# Patient Record
Sex: Male | Born: 1995 | Race: White | Hispanic: No | Marital: Single | Smoking: Never smoker
Health system: Southern US, Community
[De-identification: ages and names within clinical notes are randomized; demographics above are authoritative.]

## PROBLEM LIST (undated history)

## (undated) DIAGNOSIS — R591 Generalized enlarged lymph nodes: Secondary | ICD-10-CM

## (undated) DIAGNOSIS — R002 Palpitations: Secondary | ICD-10-CM

## (undated) DIAGNOSIS — R0789 Other chest pain: Secondary | ICD-10-CM

## (undated) DIAGNOSIS — Z8659 Personal history of other mental and behavioral disorders: Secondary | ICD-10-CM

## (undated) DIAGNOSIS — F909 Attention-deficit hyperactivity disorder, unspecified type: Secondary | ICD-10-CM

## (undated) DIAGNOSIS — L0390 Cellulitis, unspecified: Secondary | ICD-10-CM

## (undated) DIAGNOSIS — R0602 Shortness of breath: Secondary | ICD-10-CM

## (undated) HISTORY — DX: Palpitations: R00.2

## (undated) HISTORY — PX: TONSILECTOMY, ADENOIDECTOMY, BILATERAL MYRINGOTOMY AND TUBES: SHX2538

## (undated) HISTORY — DX: Cellulitis, unspecified: L03.90

## (undated) HISTORY — DX: Shortness of breath: R06.02

## (undated) HISTORY — DX: Generalized enlarged lymph nodes: R59.1

## (undated) HISTORY — DX: Personal history of other mental and behavioral disorders: Z86.59

## (undated) HISTORY — DX: Other chest pain: R07.89

---
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
## 2016-08-14 ENCOUNTER — Emergency Department (HOSPITAL_COMMUNITY): Payer: 59

## 2016-08-14 ENCOUNTER — Encounter (HOSPITAL_COMMUNITY): Payer: Self-pay | Admitting: Emergency Medicine

## 2016-08-14 ENCOUNTER — Emergency Department (HOSPITAL_COMMUNITY)
Admission: EM | Admit: 2016-08-14 | Discharge: 2016-08-15 | Disposition: A | Discharge status: Home / Self Care | Payer: 59 | Attending: Emergency Medicine | Admitting: Emergency Medicine

## 2016-08-14 DIAGNOSIS — S0993XA Unspecified injury of face, initial encounter: Secondary | ICD-10-CM | POA: Diagnosis present

## 2016-08-14 DIAGNOSIS — W501XXA Accidental kick by another person, initial encounter: Secondary | ICD-10-CM | POA: Diagnosis not present

## 2016-08-14 DIAGNOSIS — S022XXA Fracture of nasal bones, initial encounter for closed fracture: Secondary | ICD-10-CM | POA: Insufficient documentation

## 2016-08-14 DIAGNOSIS — S0501XA Injury of conjunctiva and corneal abrasion without foreign body, right eye, initial encounter: Secondary | ICD-10-CM

## 2016-08-14 DIAGNOSIS — Y9389 Activity, other specified: Secondary | ICD-10-CM | POA: Insufficient documentation

## 2016-08-14 DIAGNOSIS — F909 Attention-deficit hyperactivity disorder, unspecified type: Secondary | ICD-10-CM | POA: Insufficient documentation

## 2016-08-14 DIAGNOSIS — Y999 Unspecified external cause status: Secondary | ICD-10-CM | POA: Diagnosis not present

## 2016-08-14 DIAGNOSIS — Y9289 Other specified places as the place of occurrence of the external cause: Secondary | ICD-10-CM | POA: Insufficient documentation

## 2016-08-14 DIAGNOSIS — S0990XA Unspecified injury of head, initial encounter: Secondary | ICD-10-CM

## 2016-08-14 DIAGNOSIS — S199XXA Unspecified injury of neck, initial encounter: Secondary | ICD-10-CM | POA: Diagnosis not present

## 2016-08-14 HISTORY — DX: Attention-deficit hyperactivity disorder, unspecified type: F90.9
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
## 2016-08-15 MED ORDER — ERYTHROMYCIN 5 MG/GM OP OINT
TOPICAL_OINTMENT | Freq: Every day | OPHTHALMIC | Status: DC
  Administered 2016-08-15: 1 via OPHTHALMIC
  Filled 2016-08-15: qty 3.5

## 2016-08-15 MED ORDER — TETRACAINE HCL 0.5 % OP SOLN
1.0000 [drp] | Freq: Once | OPHTHALMIC | Status: AC
  Administered 2016-08-15: 1 [drp] via OPHTHALMIC
  Filled 2016-08-15: qty 4

## 2016-08-15 MED ORDER — FLUORESCEIN SODIUM 0.6 MG OP STRP
1.0000 | ORAL_STRIP | Freq: Once | OPHTHALMIC | Status: AC
  Administered 2016-08-15: 1 via OPHTHALMIC
  Filled 2016-08-15: qty 1

## 2016-08-15 MED ORDER — IBUPROFEN 800 MG PO TABS
800.0000 mg | ORAL_TABLET | Freq: Once | ORAL | Status: AC
  Administered 2016-08-15: 800 mg via ORAL
  Filled 2016-08-15: qty 1

## 2016-08-15 MED ORDER — CIPROFLOXACIN HCL 0.3 % OP SOLN
1.0000 [drp] | OPHTHALMIC | Status: DC
  Administered 2016-08-15: 1 [drp] via OPHTHALMIC
  Filled 2016-08-15: qty 2.5

## 2016-08-15 MED ORDER — OXYMETAZOLINE HCL 0.05 % NA SOLN
1.0000 | Freq: Once | NASAL | Status: AC
  Administered 2016-08-15: 1 via NASAL
  Filled 2016-08-15: qty 15
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
## 2016-08-15 NOTE — ED Notes (Signed)
Provider at bedside

## 2018-03-30 ENCOUNTER — Other Ambulatory Visit: Payer: Self-pay | Admitting: Physician Assistant

## 2018-03-30 ENCOUNTER — Ambulatory Visit
Admission: RE | Admit: 2018-03-30 | Discharge: 2018-03-30 | Disposition: A | Discharge status: Home / Self Care | Payer: BLUE CROSS/BLUE SHIELD | Source: Ambulatory Visit | Attending: Physician Assistant | Admitting: Physician Assistant

## 2018-03-30 DIAGNOSIS — R002 Palpitations: Secondary | ICD-10-CM

## 2018-03-30 DIAGNOSIS — R0602 Shortness of breath: Secondary | ICD-10-CM

## 2018-03-30 DIAGNOSIS — R0789 Other chest pain: Secondary | ICD-10-CM
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
## 2019-06-13 ENCOUNTER — Telehealth: Payer: Self-pay

## 2019-06-13 NOTE — Telephone Encounter (Signed)
NOTES ON FILE FROM [facility] [phone], SENT REFERRAL TO SCHEDULING 

## 2019-07-01 ENCOUNTER — Ambulatory Visit: Payer: BC Managed Care – PPO | Admitting: Cardiology

## 2019-07-11 ENCOUNTER — Ambulatory Visit: Payer: BLUE CROSS/BLUE SHIELD | Admitting: Skilled Nursing Facility1

## 2019-07-15 ENCOUNTER — Encounter: Payer: Self-pay | Admitting: Physician Assistant

## 2020-01-13 IMAGING — CR DG CHEST 2V
2 series · 2 of 2 positions shown · non-contrast
Comparison: None.

CLINICAL DATA: Shortness of breath for 1 year.

EXAM:
CHEST - 2 VIEW

[w chest pa]
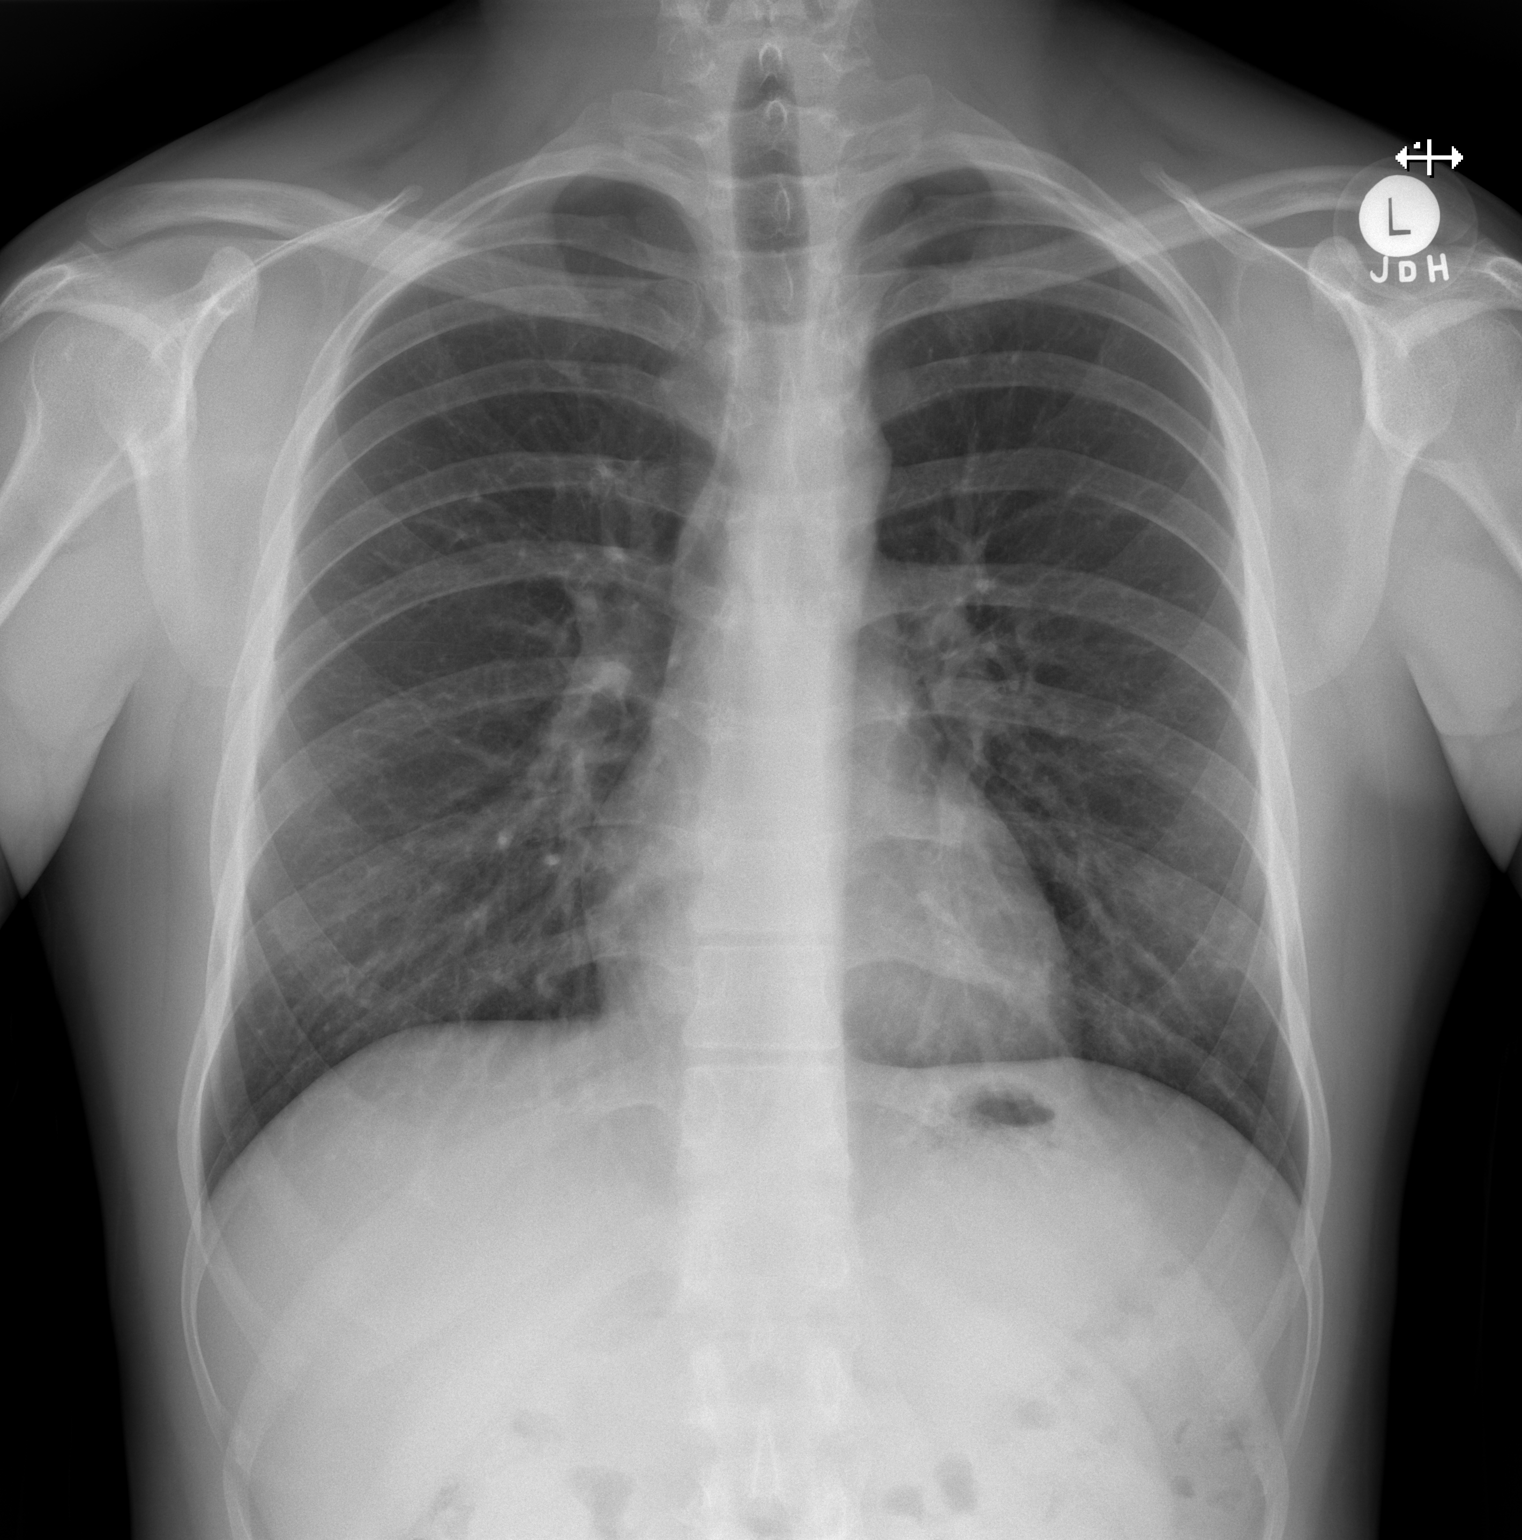

[w chest lat]
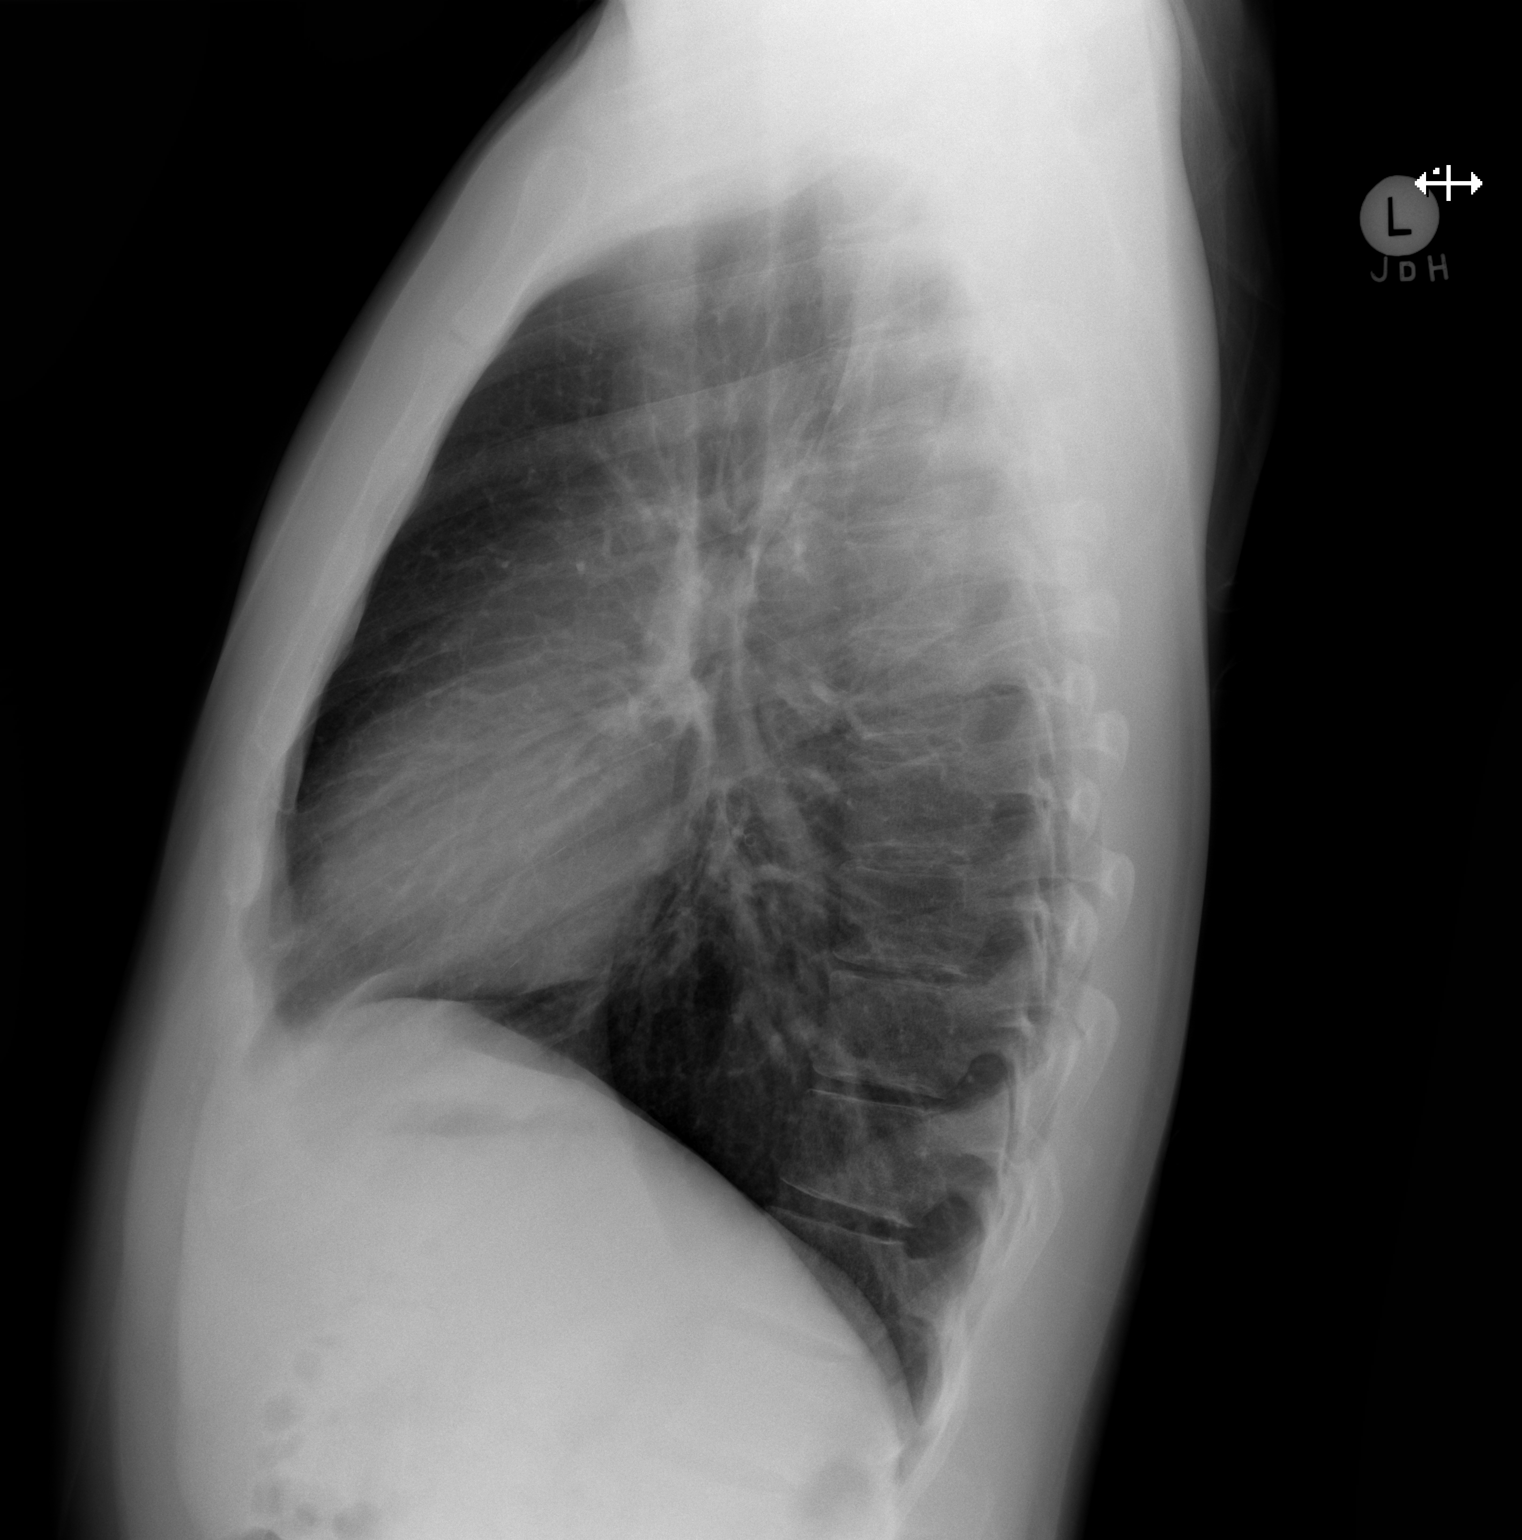

[2 of 2 positions shown; findings below may reference images not displayed]

FINDINGS: The heart size and mediastinal contours are within normal limits.
Both lungs are clear. The visualized skeletal structures are
unremarkable.
IMPRESSION: No active cardiopulmonary disease.

## 2020-04-09 ENCOUNTER — Ambulatory Visit: Payer: BC Managed Care – PPO | Admitting: Dermatology

## 2022-01-15 DIAGNOSIS — Z729 Problem related to lifestyle, unspecified: Secondary | ICD-10-CM | POA: Diagnosis not present

## 2022-01-15 DIAGNOSIS — Z600 Problems of adjustment to life-cycle transitions: Secondary | ICD-10-CM | POA: Diagnosis not present

## 2022-01-15 DIAGNOSIS — Z609 Problem related to social environment, unspecified: Secondary | ICD-10-CM | POA: Diagnosis not present

## 2022-01-15 DIAGNOSIS — R69 Illness, unspecified: Secondary | ICD-10-CM | POA: Diagnosis not present

## 2022-01-22 DIAGNOSIS — Z600 Problems of adjustment to life-cycle transitions: Secondary | ICD-10-CM | POA: Diagnosis not present

## 2022-01-22 DIAGNOSIS — Z569 Unspecified problems related to employment: Secondary | ICD-10-CM | POA: Diagnosis not present

## 2022-01-22 DIAGNOSIS — Z729 Problem related to lifestyle, unspecified: Secondary | ICD-10-CM | POA: Diagnosis not present

## 2022-01-22 DIAGNOSIS — R69 Illness, unspecified: Secondary | ICD-10-CM | POA: Diagnosis not present

## 2022-01-29 DIAGNOSIS — R69 Illness, unspecified: Secondary | ICD-10-CM | POA: Diagnosis not present

## 2022-01-29 DIAGNOSIS — Z569 Unspecified problems related to employment: Secondary | ICD-10-CM | POA: Diagnosis not present

## 2022-01-29 DIAGNOSIS — Z62811 Personal history of psychological abuse in childhood: Secondary | ICD-10-CM | POA: Diagnosis not present

## 2022-01-29 DIAGNOSIS — F902 Attention-deficit hyperactivity disorder, combined type: Secondary | ICD-10-CM | POA: Diagnosis not present

## 2022-02-05 DIAGNOSIS — Z600 Problems of adjustment to life-cycle transitions: Secondary | ICD-10-CM | POA: Diagnosis not present

## 2022-02-05 DIAGNOSIS — Z729 Problem related to lifestyle, unspecified: Secondary | ICD-10-CM | POA: Diagnosis not present

## 2022-02-05 DIAGNOSIS — R69 Illness, unspecified: Secondary | ICD-10-CM | POA: Diagnosis not present

## 2022-02-05 DIAGNOSIS — Z569 Unspecified problems related to employment: Secondary | ICD-10-CM | POA: Diagnosis not present

## 2022-02-12 DIAGNOSIS — Z600 Problems of adjustment to life-cycle transitions: Secondary | ICD-10-CM | POA: Diagnosis not present

## 2022-02-12 DIAGNOSIS — Z609 Problem related to social environment, unspecified: Secondary | ICD-10-CM | POA: Diagnosis not present

## 2022-02-12 DIAGNOSIS — Z729 Problem related to lifestyle, unspecified: Secondary | ICD-10-CM | POA: Diagnosis not present

## 2022-02-12 DIAGNOSIS — R69 Illness, unspecified: Secondary | ICD-10-CM | POA: Diagnosis not present

## 2022-02-19 DIAGNOSIS — Z600 Problems of adjustment to life-cycle transitions: Secondary | ICD-10-CM | POA: Diagnosis not present

## 2022-02-19 DIAGNOSIS — Z729 Problem related to lifestyle, unspecified: Secondary | ICD-10-CM | POA: Diagnosis not present

## 2022-02-19 DIAGNOSIS — Z609 Problem related to social environment, unspecified: Secondary | ICD-10-CM | POA: Diagnosis not present

## 2022-02-19 DIAGNOSIS — R69 Illness, unspecified: Secondary | ICD-10-CM | POA: Diagnosis not present

## 2022-02-26 DIAGNOSIS — R69 Illness, unspecified: Secondary | ICD-10-CM | POA: Diagnosis not present

## 2022-02-26 DIAGNOSIS — Z609 Problem related to social environment, unspecified: Secondary | ICD-10-CM | POA: Diagnosis not present

## 2022-02-26 DIAGNOSIS — Z634 Disappearance and death of family member: Secondary | ICD-10-CM | POA: Diagnosis not present

## 2022-03-05 DIAGNOSIS — Z634 Disappearance and death of family member: Secondary | ICD-10-CM | POA: Diagnosis not present

## 2022-03-05 DIAGNOSIS — Z729 Problem related to lifestyle, unspecified: Secondary | ICD-10-CM | POA: Diagnosis not present

## 2022-03-05 DIAGNOSIS — Z609 Problem related to social environment, unspecified: Secondary | ICD-10-CM | POA: Diagnosis not present

## 2022-03-05 DIAGNOSIS — R69 Illness, unspecified: Secondary | ICD-10-CM | POA: Diagnosis not present

## 2022-03-12 DIAGNOSIS — Z600 Problems of adjustment to life-cycle transitions: Secondary | ICD-10-CM | POA: Diagnosis not present

## 2022-03-12 DIAGNOSIS — R69 Illness, unspecified: Secondary | ICD-10-CM | POA: Diagnosis not present

## 2022-03-12 DIAGNOSIS — Z729 Problem related to lifestyle, unspecified: Secondary | ICD-10-CM | POA: Diagnosis not present

## 2022-03-12 DIAGNOSIS — Z634 Disappearance and death of family member: Secondary | ICD-10-CM | POA: Diagnosis not present

## 2022-03-19 DIAGNOSIS — Z729 Problem related to lifestyle, unspecified: Secondary | ICD-10-CM | POA: Diagnosis not present

## 2022-03-19 DIAGNOSIS — Z609 Problem related to social environment, unspecified: Secondary | ICD-10-CM | POA: Diagnosis not present

## 2022-03-19 DIAGNOSIS — R69 Illness, unspecified: Secondary | ICD-10-CM | POA: Diagnosis not present

## 2022-03-19 DIAGNOSIS — Z600 Problems of adjustment to life-cycle transitions: Secondary | ICD-10-CM | POA: Diagnosis not present

## 2022-03-26 DIAGNOSIS — R69 Illness, unspecified: Secondary | ICD-10-CM | POA: Diagnosis not present

## 2022-03-26 DIAGNOSIS — Z569 Unspecified problems related to employment: Secondary | ICD-10-CM | POA: Diagnosis not present

## 2022-03-26 DIAGNOSIS — Z600 Problems of adjustment to life-cycle transitions: Secondary | ICD-10-CM | POA: Diagnosis not present

## 2022-03-26 DIAGNOSIS — Z609 Problem related to social environment, unspecified: Secondary | ICD-10-CM | POA: Diagnosis not present

## 2022-04-02 DIAGNOSIS — Z609 Problem related to social environment, unspecified: Secondary | ICD-10-CM | POA: Diagnosis not present

## 2022-04-02 DIAGNOSIS — R69 Illness, unspecified: Secondary | ICD-10-CM | POA: Diagnosis not present

## 2022-04-02 DIAGNOSIS — Z600 Problems of adjustment to life-cycle transitions: Secondary | ICD-10-CM | POA: Diagnosis not present

## 2022-04-02 DIAGNOSIS — F900 Attention-deficit hyperactivity disorder, predominantly inattentive type: Secondary | ICD-10-CM | POA: Diagnosis not present

## 2022-04-09 DIAGNOSIS — Z600 Problems of adjustment to life-cycle transitions: Secondary | ICD-10-CM | POA: Diagnosis not present

## 2022-04-09 DIAGNOSIS — Z569 Unspecified problems related to employment: Secondary | ICD-10-CM | POA: Diagnosis not present

## 2022-04-09 DIAGNOSIS — F900 Attention-deficit hyperactivity disorder, predominantly inattentive type: Secondary | ICD-10-CM | POA: Diagnosis not present

## 2022-04-09 DIAGNOSIS — R69 Illness, unspecified: Secondary | ICD-10-CM | POA: Diagnosis not present

## 2022-04-16 DIAGNOSIS — Z600 Problems of adjustment to life-cycle transitions: Secondary | ICD-10-CM | POA: Diagnosis not present

## 2022-04-16 DIAGNOSIS — R69 Illness, unspecified: Secondary | ICD-10-CM | POA: Diagnosis not present

## 2022-04-16 DIAGNOSIS — Z569 Unspecified problems related to employment: Secondary | ICD-10-CM | POA: Diagnosis not present

## 2022-04-16 DIAGNOSIS — Z609 Problem related to social environment, unspecified: Secondary | ICD-10-CM | POA: Diagnosis not present

## 2022-04-23 DIAGNOSIS — Z600 Problems of adjustment to life-cycle transitions: Secondary | ICD-10-CM | POA: Diagnosis not present

## 2022-04-23 DIAGNOSIS — Z569 Unspecified problems related to employment: Secondary | ICD-10-CM | POA: Diagnosis not present

## 2022-04-23 DIAGNOSIS — Z634 Disappearance and death of family member: Secondary | ICD-10-CM | POA: Diagnosis not present

## 2022-04-23 DIAGNOSIS — R69 Illness, unspecified: Secondary | ICD-10-CM | POA: Diagnosis not present

## 2022-04-30 DIAGNOSIS — R69 Illness, unspecified: Secondary | ICD-10-CM | POA: Diagnosis not present

## 2022-04-30 DIAGNOSIS — Z609 Problem related to social environment, unspecified: Secondary | ICD-10-CM | POA: Diagnosis not present

## 2022-04-30 DIAGNOSIS — Z638 Other specified problems related to primary support group: Secondary | ICD-10-CM | POA: Diagnosis not present

## 2022-04-30 DIAGNOSIS — Z729 Problem related to lifestyle, unspecified: Secondary | ICD-10-CM | POA: Diagnosis not present

## 2022-05-07 DIAGNOSIS — Z609 Problem related to social environment, unspecified: Secondary | ICD-10-CM | POA: Diagnosis not present

## 2022-05-07 DIAGNOSIS — Z638 Other specified problems related to primary support group: Secondary | ICD-10-CM | POA: Diagnosis not present

## 2022-05-07 DIAGNOSIS — R69 Illness, unspecified: Secondary | ICD-10-CM | POA: Diagnosis not present

## 2022-05-07 DIAGNOSIS — Z729 Problem related to lifestyle, unspecified: Secondary | ICD-10-CM | POA: Diagnosis not present

## 2022-05-14 DIAGNOSIS — F900 Attention-deficit hyperactivity disorder, predominantly inattentive type: Secondary | ICD-10-CM | POA: Diagnosis not present

## 2022-05-14 DIAGNOSIS — R69 Illness, unspecified: Secondary | ICD-10-CM | POA: Diagnosis not present

## 2022-05-14 DIAGNOSIS — Z609 Problem related to social environment, unspecified: Secondary | ICD-10-CM | POA: Diagnosis not present

## 2022-05-14 DIAGNOSIS — Z569 Unspecified problems related to employment: Secondary | ICD-10-CM | POA: Diagnosis not present

## 2022-05-21 DIAGNOSIS — Z600 Problems of adjustment to life-cycle transitions: Secondary | ICD-10-CM | POA: Diagnosis not present

## 2022-05-21 DIAGNOSIS — Z569 Unspecified problems related to employment: Secondary | ICD-10-CM | POA: Diagnosis not present

## 2022-05-21 DIAGNOSIS — Z729 Problem related to lifestyle, unspecified: Secondary | ICD-10-CM | POA: Diagnosis not present

## 2022-05-21 DIAGNOSIS — R69 Illness, unspecified: Secondary | ICD-10-CM | POA: Diagnosis not present

## 2022-05-28 DIAGNOSIS — F900 Attention-deficit hyperactivity disorder, predominantly inattentive type: Secondary | ICD-10-CM | POA: Diagnosis not present

## 2022-05-28 DIAGNOSIS — R69 Illness, unspecified: Secondary | ICD-10-CM | POA: Diagnosis not present

## 2022-05-28 DIAGNOSIS — Z729 Problem related to lifestyle, unspecified: Secondary | ICD-10-CM | POA: Diagnosis not present

## 2022-05-28 DIAGNOSIS — Z569 Unspecified problems related to employment: Secondary | ICD-10-CM | POA: Diagnosis not present

## 2022-06-04 DIAGNOSIS — F900 Attention-deficit hyperactivity disorder, predominantly inattentive type: Secondary | ICD-10-CM | POA: Diagnosis not present

## 2022-06-04 DIAGNOSIS — Z569 Unspecified problems related to employment: Secondary | ICD-10-CM | POA: Diagnosis not present

## 2022-06-04 DIAGNOSIS — R69 Illness, unspecified: Secondary | ICD-10-CM | POA: Diagnosis not present

## 2022-06-04 DIAGNOSIS — Z729 Problem related to lifestyle, unspecified: Secondary | ICD-10-CM | POA: Diagnosis not present

## 2022-06-11 DIAGNOSIS — Z62820 Parent-biological child conflict: Secondary | ICD-10-CM | POA: Diagnosis not present

## 2022-06-11 DIAGNOSIS — R69 Illness, unspecified: Secondary | ICD-10-CM | POA: Diagnosis not present

## 2022-06-11 DIAGNOSIS — Z569 Unspecified problems related to employment: Secondary | ICD-10-CM | POA: Diagnosis not present

## 2022-06-11 DIAGNOSIS — F900 Attention-deficit hyperactivity disorder, predominantly inattentive type: Secondary | ICD-10-CM | POA: Diagnosis not present

## 2022-06-14 DIAGNOSIS — N50819 Testicular pain, unspecified: Secondary | ICD-10-CM | POA: Diagnosis not present

## 2022-06-14 DIAGNOSIS — R300 Dysuria: Secondary | ICD-10-CM | POA: Diagnosis not present

## 2022-06-18 DIAGNOSIS — R69 Illness, unspecified: Secondary | ICD-10-CM | POA: Diagnosis not present

## 2022-06-18 DIAGNOSIS — Z569 Unspecified problems related to employment: Secondary | ICD-10-CM | POA: Diagnosis not present

## 2022-06-18 DIAGNOSIS — Z729 Problem related to lifestyle, unspecified: Secondary | ICD-10-CM | POA: Diagnosis not present

## 2022-06-18 DIAGNOSIS — Z609 Problem related to social environment, unspecified: Secondary | ICD-10-CM | POA: Diagnosis not present

## 2022-07-02 DIAGNOSIS — Z729 Problem related to lifestyle, unspecified: Secondary | ICD-10-CM | POA: Diagnosis not present

## 2022-07-02 DIAGNOSIS — Z62820 Parent-biological child conflict: Secondary | ICD-10-CM | POA: Diagnosis not present

## 2022-07-02 DIAGNOSIS — Z609 Problem related to social environment, unspecified: Secondary | ICD-10-CM | POA: Diagnosis not present

## 2022-07-02 DIAGNOSIS — R69 Illness, unspecified: Secondary | ICD-10-CM | POA: Diagnosis not present

## 2022-07-09 DIAGNOSIS — Z600 Problems of adjustment to life-cycle transitions: Secondary | ICD-10-CM | POA: Diagnosis not present

## 2022-07-09 DIAGNOSIS — R69 Illness, unspecified: Secondary | ICD-10-CM | POA: Diagnosis not present

## 2022-07-09 DIAGNOSIS — Z609 Problem related to social environment, unspecified: Secondary | ICD-10-CM | POA: Diagnosis not present

## 2022-07-09 DIAGNOSIS — Z569 Unspecified problems related to employment: Secondary | ICD-10-CM | POA: Diagnosis not present

## 2022-07-17 DIAGNOSIS — Z729 Problem related to lifestyle, unspecified: Secondary | ICD-10-CM | POA: Diagnosis not present

## 2022-07-17 DIAGNOSIS — R69 Illness, unspecified: Secondary | ICD-10-CM | POA: Diagnosis not present

## 2022-07-17 DIAGNOSIS — Z569 Unspecified problems related to employment: Secondary | ICD-10-CM | POA: Diagnosis not present

## 2022-07-17 DIAGNOSIS — Z609 Problem related to social environment, unspecified: Secondary | ICD-10-CM | POA: Diagnosis not present

## 2022-07-23 DIAGNOSIS — Z569 Unspecified problems related to employment: Secondary | ICD-10-CM | POA: Diagnosis not present

## 2022-07-23 DIAGNOSIS — Z729 Problem related to lifestyle, unspecified: Secondary | ICD-10-CM | POA: Diagnosis not present

## 2022-07-23 DIAGNOSIS — Z600 Problems of adjustment to life-cycle transitions: Secondary | ICD-10-CM | POA: Diagnosis not present

## 2022-07-23 DIAGNOSIS — R69 Illness, unspecified: Secondary | ICD-10-CM | POA: Diagnosis not present

## 2022-07-30 DIAGNOSIS — Z609 Problem related to social environment, unspecified: Secondary | ICD-10-CM | POA: Diagnosis not present

## 2022-07-30 DIAGNOSIS — Z569 Unspecified problems related to employment: Secondary | ICD-10-CM | POA: Diagnosis not present

## 2022-07-30 DIAGNOSIS — R69 Illness, unspecified: Secondary | ICD-10-CM | POA: Diagnosis not present

## 2022-07-30 DIAGNOSIS — Z62820 Parent-biological child conflict: Secondary | ICD-10-CM | POA: Diagnosis not present

## 2022-08-13 DIAGNOSIS — Z729 Problem related to lifestyle, unspecified: Secondary | ICD-10-CM | POA: Diagnosis not present

## 2022-08-13 DIAGNOSIS — F4323 Adjustment disorder with mixed anxiety and depressed mood: Secondary | ICD-10-CM | POA: Diagnosis not present

## 2022-08-13 DIAGNOSIS — Z569 Unspecified problems related to employment: Secondary | ICD-10-CM | POA: Diagnosis not present

## 2022-08-13 DIAGNOSIS — Z609 Problem related to social environment, unspecified: Secondary | ICD-10-CM | POA: Diagnosis not present

## 2022-08-22 DIAGNOSIS — Z729 Problem related to lifestyle, unspecified: Secondary | ICD-10-CM | POA: Diagnosis not present

## 2022-08-22 DIAGNOSIS — Z569 Unspecified problems related to employment: Secondary | ICD-10-CM | POA: Diagnosis not present

## 2022-08-22 DIAGNOSIS — F4323 Adjustment disorder with mixed anxiety and depressed mood: Secondary | ICD-10-CM | POA: Diagnosis not present

## 2022-08-22 DIAGNOSIS — Z609 Problem related to social environment, unspecified: Secondary | ICD-10-CM | POA: Diagnosis not present

## 2022-08-27 DIAGNOSIS — F4323 Adjustment disorder with mixed anxiety and depressed mood: Secondary | ICD-10-CM | POA: Diagnosis not present

## 2022-08-27 DIAGNOSIS — Z729 Problem related to lifestyle, unspecified: Secondary | ICD-10-CM | POA: Diagnosis not present

## 2022-08-27 DIAGNOSIS — Z609 Problem related to social environment, unspecified: Secondary | ICD-10-CM | POA: Diagnosis not present

## 2022-09-03 DIAGNOSIS — F4323 Adjustment disorder with mixed anxiety and depressed mood: Secondary | ICD-10-CM | POA: Diagnosis not present

## 2022-09-03 DIAGNOSIS — Z569 Unspecified problems related to employment: Secondary | ICD-10-CM | POA: Diagnosis not present

## 2022-09-03 DIAGNOSIS — Z609 Problem related to social environment, unspecified: Secondary | ICD-10-CM | POA: Diagnosis not present

## 2022-09-03 DIAGNOSIS — Z729 Problem related to lifestyle, unspecified: Secondary | ICD-10-CM | POA: Diagnosis not present

## 2022-09-10 DIAGNOSIS — Z729 Problem related to lifestyle, unspecified: Secondary | ICD-10-CM | POA: Diagnosis not present

## 2022-09-10 DIAGNOSIS — Z609 Problem related to social environment, unspecified: Secondary | ICD-10-CM | POA: Diagnosis not present

## 2022-09-10 DIAGNOSIS — F4323 Adjustment disorder with mixed anxiety and depressed mood: Secondary | ICD-10-CM | POA: Diagnosis not present

## 2022-09-17 DIAGNOSIS — Z609 Problem related to social environment, unspecified: Secondary | ICD-10-CM | POA: Diagnosis not present

## 2022-09-17 DIAGNOSIS — Z729 Problem related to lifestyle, unspecified: Secondary | ICD-10-CM | POA: Diagnosis not present

## 2022-09-17 DIAGNOSIS — F4323 Adjustment disorder with mixed anxiety and depressed mood: Secondary | ICD-10-CM | POA: Diagnosis not present

## 2022-09-17 DIAGNOSIS — Z569 Unspecified problems related to employment: Secondary | ICD-10-CM | POA: Diagnosis not present

## 2022-09-22 DIAGNOSIS — Z569 Unspecified problems related to employment: Secondary | ICD-10-CM | POA: Diagnosis not present

## 2022-09-22 DIAGNOSIS — Z609 Problem related to social environment, unspecified: Secondary | ICD-10-CM | POA: Diagnosis not present

## 2022-09-22 DIAGNOSIS — F4323 Adjustment disorder with mixed anxiety and depressed mood: Secondary | ICD-10-CM | POA: Diagnosis not present

## 2022-10-01 DIAGNOSIS — Z609 Problem related to social environment, unspecified: Secondary | ICD-10-CM | POA: Diagnosis not present

## 2022-10-01 DIAGNOSIS — Z569 Unspecified problems related to employment: Secondary | ICD-10-CM | POA: Diagnosis not present

## 2022-10-01 DIAGNOSIS — F4323 Adjustment disorder with mixed anxiety and depressed mood: Secondary | ICD-10-CM | POA: Diagnosis not present

## 2022-10-08 DIAGNOSIS — Z729 Problem related to lifestyle, unspecified: Secondary | ICD-10-CM | POA: Diagnosis not present

## 2022-10-08 DIAGNOSIS — Z569 Unspecified problems related to employment: Secondary | ICD-10-CM | POA: Diagnosis not present

## 2022-10-08 DIAGNOSIS — Z609 Problem related to social environment, unspecified: Secondary | ICD-10-CM | POA: Diagnosis not present

## 2022-10-08 DIAGNOSIS — F4323 Adjustment disorder with mixed anxiety and depressed mood: Secondary | ICD-10-CM | POA: Diagnosis not present

## 2022-10-15 DIAGNOSIS — F4323 Adjustment disorder with mixed anxiety and depressed mood: Secondary | ICD-10-CM | POA: Diagnosis not present

## 2022-10-15 DIAGNOSIS — Z609 Problem related to social environment, unspecified: Secondary | ICD-10-CM | POA: Diagnosis not present

## 2022-10-15 DIAGNOSIS — Z729 Problem related to lifestyle, unspecified: Secondary | ICD-10-CM | POA: Diagnosis not present

## 2022-10-22 DIAGNOSIS — F4323 Adjustment disorder with mixed anxiety and depressed mood: Secondary | ICD-10-CM | POA: Diagnosis not present

## 2022-10-22 DIAGNOSIS — Z729 Problem related to lifestyle, unspecified: Secondary | ICD-10-CM | POA: Diagnosis not present

## 2022-10-22 DIAGNOSIS — Z609 Problem related to social environment, unspecified: Secondary | ICD-10-CM | POA: Diagnosis not present

## 2022-10-22 DIAGNOSIS — Z569 Unspecified problems related to employment: Secondary | ICD-10-CM | POA: Diagnosis not present

## 2022-10-29 DIAGNOSIS — Z609 Problem related to social environment, unspecified: Secondary | ICD-10-CM | POA: Diagnosis not present

## 2022-10-29 DIAGNOSIS — Z729 Problem related to lifestyle, unspecified: Secondary | ICD-10-CM | POA: Diagnosis not present

## 2022-10-29 DIAGNOSIS — Z569 Unspecified problems related to employment: Secondary | ICD-10-CM | POA: Diagnosis not present

## 2022-10-29 DIAGNOSIS — F4323 Adjustment disorder with mixed anxiety and depressed mood: Secondary | ICD-10-CM | POA: Diagnosis not present

## 2022-11-05 DIAGNOSIS — Z62811 Personal history of psychological abuse in childhood: Secondary | ICD-10-CM | POA: Diagnosis not present

## 2022-11-05 DIAGNOSIS — Z609 Problem related to social environment, unspecified: Secondary | ICD-10-CM | POA: Diagnosis not present

## 2022-11-05 DIAGNOSIS — F4323 Adjustment disorder with mixed anxiety and depressed mood: Secondary | ICD-10-CM | POA: Diagnosis not present

## 2022-11-05 DIAGNOSIS — Z638 Other specified problems related to primary support group: Secondary | ICD-10-CM | POA: Diagnosis not present

## 2022-11-11 DIAGNOSIS — M25531 Pain in right wrist: Secondary | ICD-10-CM | POA: Diagnosis not present

## 2022-11-12 DIAGNOSIS — Z638 Other specified problems related to primary support group: Secondary | ICD-10-CM | POA: Diagnosis not present

## 2022-11-12 DIAGNOSIS — F4323 Adjustment disorder with mixed anxiety and depressed mood: Secondary | ICD-10-CM | POA: Diagnosis not present

## 2022-11-12 DIAGNOSIS — Z609 Problem related to social environment, unspecified: Secondary | ICD-10-CM | POA: Diagnosis not present

## 2022-11-19 DIAGNOSIS — Z729 Problem related to lifestyle, unspecified: Secondary | ICD-10-CM | POA: Diagnosis not present

## 2022-11-19 DIAGNOSIS — F4323 Adjustment disorder with mixed anxiety and depressed mood: Secondary | ICD-10-CM | POA: Diagnosis not present

## 2022-11-19 DIAGNOSIS — Z569 Unspecified problems related to employment: Secondary | ICD-10-CM | POA: Diagnosis not present

## 2022-11-19 DIAGNOSIS — Z609 Problem related to social environment, unspecified: Secondary | ICD-10-CM | POA: Diagnosis not present

## 2022-11-26 DIAGNOSIS — Z729 Problem related to lifestyle, unspecified: Secondary | ICD-10-CM | POA: Diagnosis not present

## 2022-11-26 DIAGNOSIS — F4323 Adjustment disorder with mixed anxiety and depressed mood: Secondary | ICD-10-CM | POA: Diagnosis not present

## 2022-11-26 DIAGNOSIS — Z609 Problem related to social environment, unspecified: Secondary | ICD-10-CM | POA: Diagnosis not present

## 2022-12-05 DIAGNOSIS — Z630 Problems in relationship with spouse or partner: Secondary | ICD-10-CM | POA: Diagnosis not present

## 2022-12-05 DIAGNOSIS — Z729 Problem related to lifestyle, unspecified: Secondary | ICD-10-CM | POA: Diagnosis not present

## 2022-12-05 DIAGNOSIS — Z569 Unspecified problems related to employment: Secondary | ICD-10-CM | POA: Diagnosis not present

## 2022-12-05 DIAGNOSIS — F4323 Adjustment disorder with mixed anxiety and depressed mood: Secondary | ICD-10-CM | POA: Diagnosis not present

## 2022-12-10 DIAGNOSIS — F4323 Adjustment disorder with mixed anxiety and depressed mood: Secondary | ICD-10-CM | POA: Diagnosis not present

## 2022-12-10 DIAGNOSIS — Z630 Problems in relationship with spouse or partner: Secondary | ICD-10-CM | POA: Diagnosis not present

## 2022-12-10 DIAGNOSIS — Z729 Problem related to lifestyle, unspecified: Secondary | ICD-10-CM | POA: Diagnosis not present

## 2022-12-15 DIAGNOSIS — R1031 Right lower quadrant pain: Secondary | ICD-10-CM | POA: Diagnosis not present

## 2022-12-15 DIAGNOSIS — R109 Unspecified abdominal pain: Secondary | ICD-10-CM | POA: Diagnosis not present

## 2022-12-17 DIAGNOSIS — Z630 Problems in relationship with spouse or partner: Secondary | ICD-10-CM | POA: Diagnosis not present

## 2022-12-17 DIAGNOSIS — Z729 Problem related to lifestyle, unspecified: Secondary | ICD-10-CM | POA: Diagnosis not present

## 2022-12-17 DIAGNOSIS — F4323 Adjustment disorder with mixed anxiety and depressed mood: Secondary | ICD-10-CM | POA: Diagnosis not present

## 2022-12-24 DIAGNOSIS — Z630 Problems in relationship with spouse or partner: Secondary | ICD-10-CM | POA: Diagnosis not present

## 2022-12-24 DIAGNOSIS — Z569 Unspecified problems related to employment: Secondary | ICD-10-CM | POA: Diagnosis not present

## 2022-12-24 DIAGNOSIS — F4323 Adjustment disorder with mixed anxiety and depressed mood: Secondary | ICD-10-CM | POA: Diagnosis not present

## 2022-12-24 DIAGNOSIS — Z729 Problem related to lifestyle, unspecified: Secondary | ICD-10-CM | POA: Diagnosis not present

## 2022-12-31 DIAGNOSIS — Z630 Problems in relationship with spouse or partner: Secondary | ICD-10-CM | POA: Diagnosis not present

## 2022-12-31 DIAGNOSIS — F4323 Adjustment disorder with mixed anxiety and depressed mood: Secondary | ICD-10-CM | POA: Diagnosis not present

## 2023-01-07 DIAGNOSIS — Z630 Problems in relationship with spouse or partner: Secondary | ICD-10-CM | POA: Diagnosis not present

## 2023-01-07 DIAGNOSIS — F4323 Adjustment disorder with mixed anxiety and depressed mood: Secondary | ICD-10-CM | POA: Diagnosis not present

## 2023-01-14 DIAGNOSIS — Z630 Problems in relationship with spouse or partner: Secondary | ICD-10-CM | POA: Diagnosis not present

## 2023-01-14 DIAGNOSIS — F4323 Adjustment disorder with mixed anxiety and depressed mood: Secondary | ICD-10-CM | POA: Diagnosis not present

## 2023-01-21 DIAGNOSIS — Z630 Problems in relationship with spouse or partner: Secondary | ICD-10-CM | POA: Diagnosis not present

## 2023-01-21 DIAGNOSIS — Z569 Unspecified problems related to employment: Secondary | ICD-10-CM | POA: Diagnosis not present

## 2023-01-21 DIAGNOSIS — F4323 Adjustment disorder with mixed anxiety and depressed mood: Secondary | ICD-10-CM | POA: Diagnosis not present

## 2023-01-21 DIAGNOSIS — Z729 Problem related to lifestyle, unspecified: Secondary | ICD-10-CM | POA: Diagnosis not present

## 2023-01-28 DIAGNOSIS — F4323 Adjustment disorder with mixed anxiety and depressed mood: Secondary | ICD-10-CM | POA: Diagnosis not present

## 2023-01-28 DIAGNOSIS — Z729 Problem related to lifestyle, unspecified: Secondary | ICD-10-CM | POA: Diagnosis not present

## 2023-01-28 DIAGNOSIS — Z569 Unspecified problems related to employment: Secondary | ICD-10-CM | POA: Diagnosis not present

## 2023-01-28 DIAGNOSIS — Z630 Problems in relationship with spouse or partner: Secondary | ICD-10-CM | POA: Diagnosis not present

## 2023-02-04 DIAGNOSIS — F4323 Adjustment disorder with mixed anxiety and depressed mood: Secondary | ICD-10-CM | POA: Diagnosis not present

## 2023-02-04 DIAGNOSIS — Z630 Problems in relationship with spouse or partner: Secondary | ICD-10-CM | POA: Diagnosis not present

## 2023-02-18 DIAGNOSIS — Z630 Problems in relationship with spouse or partner: Secondary | ICD-10-CM | POA: Diagnosis not present

## 2023-02-18 DIAGNOSIS — F4323 Adjustment disorder with mixed anxiety and depressed mood: Secondary | ICD-10-CM | POA: Diagnosis not present

## 2023-02-18 DIAGNOSIS — Z729 Problem related to lifestyle, unspecified: Secondary | ICD-10-CM | POA: Diagnosis not present

## 2023-02-25 DIAGNOSIS — Z630 Problems in relationship with spouse or partner: Secondary | ICD-10-CM | POA: Diagnosis not present

## 2023-02-25 DIAGNOSIS — F4323 Adjustment disorder with mixed anxiety and depressed mood: Secondary | ICD-10-CM | POA: Diagnosis not present

## 2023-02-25 DIAGNOSIS — Z729 Problem related to lifestyle, unspecified: Secondary | ICD-10-CM | POA: Diagnosis not present

## 2023-03-04 DIAGNOSIS — F4323 Adjustment disorder with mixed anxiety and depressed mood: Secondary | ICD-10-CM | POA: Diagnosis not present

## 2023-03-04 DIAGNOSIS — Z630 Problems in relationship with spouse or partner: Secondary | ICD-10-CM | POA: Diagnosis not present

## 2023-03-04 DIAGNOSIS — Z729 Problem related to lifestyle, unspecified: Secondary | ICD-10-CM | POA: Diagnosis not present

## 2023-03-11 DIAGNOSIS — Z630 Problems in relationship with spouse or partner: Secondary | ICD-10-CM | POA: Diagnosis not present

## 2023-03-11 DIAGNOSIS — F4323 Adjustment disorder with mixed anxiety and depressed mood: Secondary | ICD-10-CM | POA: Diagnosis not present

## 2023-03-11 DIAGNOSIS — Z729 Problem related to lifestyle, unspecified: Secondary | ICD-10-CM | POA: Diagnosis not present

## 2023-03-11 DIAGNOSIS — Z569 Unspecified problems related to employment: Secondary | ICD-10-CM | POA: Diagnosis not present

## 2023-03-18 DIAGNOSIS — Z729 Problem related to lifestyle, unspecified: Secondary | ICD-10-CM | POA: Diagnosis not present

## 2023-03-18 DIAGNOSIS — Z630 Problems in relationship with spouse or partner: Secondary | ICD-10-CM | POA: Diagnosis not present

## 2023-03-18 DIAGNOSIS — F4323 Adjustment disorder with mixed anxiety and depressed mood: Secondary | ICD-10-CM | POA: Diagnosis not present

## 2023-03-25 DIAGNOSIS — Z729 Problem related to lifestyle, unspecified: Secondary | ICD-10-CM | POA: Diagnosis not present

## 2023-03-25 DIAGNOSIS — F4323 Adjustment disorder with mixed anxiety and depressed mood: Secondary | ICD-10-CM | POA: Diagnosis not present

## 2023-03-25 DIAGNOSIS — Z630 Problems in relationship with spouse or partner: Secondary | ICD-10-CM | POA: Diagnosis not present

## 2023-03-25 DIAGNOSIS — Z569 Unspecified problems related to employment: Secondary | ICD-10-CM | POA: Diagnosis not present

## 2023-04-01 DIAGNOSIS — Z729 Problem related to lifestyle, unspecified: Secondary | ICD-10-CM | POA: Diagnosis not present

## 2023-04-01 DIAGNOSIS — Z609 Problem related to social environment, unspecified: Secondary | ICD-10-CM | POA: Diagnosis not present

## 2023-04-01 DIAGNOSIS — F4323 Adjustment disorder with mixed anxiety and depressed mood: Secondary | ICD-10-CM | POA: Diagnosis not present

## 2023-04-08 DIAGNOSIS — F4323 Adjustment disorder with mixed anxiety and depressed mood: Secondary | ICD-10-CM | POA: Diagnosis not present

## 2023-04-08 DIAGNOSIS — Z609 Problem related to social environment, unspecified: Secondary | ICD-10-CM | POA: Diagnosis not present

## 2023-04-08 DIAGNOSIS — Z729 Problem related to lifestyle, unspecified: Secondary | ICD-10-CM | POA: Diagnosis not present

## 2023-04-15 DIAGNOSIS — Z569 Unspecified problems related to employment: Secondary | ICD-10-CM | POA: Diagnosis not present

## 2023-04-15 DIAGNOSIS — Z609 Problem related to social environment, unspecified: Secondary | ICD-10-CM | POA: Diagnosis not present

## 2023-04-15 DIAGNOSIS — Z729 Problem related to lifestyle, unspecified: Secondary | ICD-10-CM | POA: Diagnosis not present

## 2023-04-15 DIAGNOSIS — F4323 Adjustment disorder with mixed anxiety and depressed mood: Secondary | ICD-10-CM | POA: Diagnosis not present

## 2023-04-22 DIAGNOSIS — F4323 Adjustment disorder with mixed anxiety and depressed mood: Secondary | ICD-10-CM | POA: Diagnosis not present

## 2023-04-22 DIAGNOSIS — Z729 Problem related to lifestyle, unspecified: Secondary | ICD-10-CM | POA: Diagnosis not present

## 2023-04-22 DIAGNOSIS — Z569 Unspecified problems related to employment: Secondary | ICD-10-CM | POA: Diagnosis not present

## 2023-04-22 DIAGNOSIS — Z609 Problem related to social environment, unspecified: Secondary | ICD-10-CM | POA: Diagnosis not present

## 2023-04-29 DIAGNOSIS — Z609 Problem related to social environment, unspecified: Secondary | ICD-10-CM | POA: Diagnosis not present

## 2023-04-29 DIAGNOSIS — F4323 Adjustment disorder with mixed anxiety and depressed mood: Secondary | ICD-10-CM | POA: Diagnosis not present

## 2023-04-29 DIAGNOSIS — Z569 Unspecified problems related to employment: Secondary | ICD-10-CM | POA: Diagnosis not present

## 2023-04-29 DIAGNOSIS — Z729 Problem related to lifestyle, unspecified: Secondary | ICD-10-CM | POA: Diagnosis not present

## 2023-05-06 DIAGNOSIS — F4323 Adjustment disorder with mixed anxiety and depressed mood: Secondary | ICD-10-CM | POA: Diagnosis not present

## 2023-05-06 DIAGNOSIS — Z729 Problem related to lifestyle, unspecified: Secondary | ICD-10-CM | POA: Diagnosis not present

## 2023-05-06 DIAGNOSIS — Z609 Problem related to social environment, unspecified: Secondary | ICD-10-CM | POA: Diagnosis not present

## 2023-05-06 DIAGNOSIS — Z569 Unspecified problems related to employment: Secondary | ICD-10-CM | POA: Diagnosis not present

## 2023-05-13 DIAGNOSIS — Z62820 Parent-biological child conflict: Secondary | ICD-10-CM | POA: Diagnosis not present

## 2023-05-13 DIAGNOSIS — R220 Localized swelling, mass and lump, head: Secondary | ICD-10-CM | POA: Diagnosis not present

## 2023-05-13 DIAGNOSIS — Z609 Problem related to social environment, unspecified: Secondary | ICD-10-CM | POA: Diagnosis not present

## 2023-05-13 DIAGNOSIS — F4323 Adjustment disorder with mixed anxiety and depressed mood: Secondary | ICD-10-CM | POA: Diagnosis not present

## 2023-05-13 DIAGNOSIS — Z729 Problem related to lifestyle, unspecified: Secondary | ICD-10-CM | POA: Diagnosis not present

## 2023-05-20 DIAGNOSIS — Z729 Problem related to lifestyle, unspecified: Secondary | ICD-10-CM | POA: Diagnosis not present

## 2023-05-20 DIAGNOSIS — Z569 Unspecified problems related to employment: Secondary | ICD-10-CM | POA: Diagnosis not present

## 2023-05-20 DIAGNOSIS — F4323 Adjustment disorder with mixed anxiety and depressed mood: Secondary | ICD-10-CM | POA: Diagnosis not present

## 2023-05-27 DIAGNOSIS — Z609 Problem related to social environment, unspecified: Secondary | ICD-10-CM | POA: Diagnosis not present

## 2023-05-27 DIAGNOSIS — F4323 Adjustment disorder with mixed anxiety and depressed mood: Secondary | ICD-10-CM | POA: Diagnosis not present

## 2023-05-27 DIAGNOSIS — Z62820 Parent-biological child conflict: Secondary | ICD-10-CM | POA: Diagnosis not present

## 2023-05-27 DIAGNOSIS — Z569 Unspecified problems related to employment: Secondary | ICD-10-CM | POA: Diagnosis not present

## 2023-06-03 DIAGNOSIS — Z609 Problem related to social environment, unspecified: Secondary | ICD-10-CM | POA: Diagnosis not present

## 2023-06-03 DIAGNOSIS — Z8781 Personal history of (healed) traumatic fracture: Secondary | ICD-10-CM | POA: Diagnosis not present

## 2023-06-03 DIAGNOSIS — R29898 Other symptoms and signs involving the musculoskeletal system: Secondary | ICD-10-CM | POA: Diagnosis not present

## 2023-06-03 DIAGNOSIS — Z113 Encounter for screening for infections with a predominantly sexual mode of transmission: Secondary | ICD-10-CM | POA: Diagnosis not present

## 2023-06-03 DIAGNOSIS — Z9889 Other specified postprocedural states: Secondary | ICD-10-CM | POA: Diagnosis not present

## 2023-06-03 DIAGNOSIS — J342 Deviated nasal septum: Secondary | ICD-10-CM | POA: Diagnosis not present

## 2023-06-03 DIAGNOSIS — Z638 Other specified problems related to primary support group: Secondary | ICD-10-CM | POA: Diagnosis not present

## 2023-06-03 DIAGNOSIS — F4323 Adjustment disorder with mixed anxiety and depressed mood: Secondary | ICD-10-CM | POA: Diagnosis not present

## 2023-06-03 DIAGNOSIS — N529 Male erectile dysfunction, unspecified: Secondary | ICD-10-CM | POA: Diagnosis not present

## 2023-06-03 DIAGNOSIS — R0681 Apnea, not elsewhere classified: Secondary | ICD-10-CM | POA: Diagnosis not present

## 2023-06-03 DIAGNOSIS — R519 Headache, unspecified: Secondary | ICD-10-CM | POA: Diagnosis not present

## 2023-06-03 DIAGNOSIS — M25531 Pain in right wrist: Secondary | ICD-10-CM | POA: Diagnosis not present

## 2023-06-03 DIAGNOSIS — L989 Disorder of the skin and subcutaneous tissue, unspecified: Secondary | ICD-10-CM | POA: Diagnosis not present

## 2023-06-03 DIAGNOSIS — Z729 Problem related to lifestyle, unspecified: Secondary | ICD-10-CM | POA: Diagnosis not present

## 2023-06-03 DIAGNOSIS — M25522 Pain in left elbow: Secondary | ICD-10-CM | POA: Diagnosis not present

## 2023-06-16 DIAGNOSIS — F4323 Adjustment disorder with mixed anxiety and depressed mood: Secondary | ICD-10-CM | POA: Diagnosis not present

## 2023-06-16 DIAGNOSIS — Z729 Problem related to lifestyle, unspecified: Secondary | ICD-10-CM | POA: Diagnosis not present

## 2023-06-16 DIAGNOSIS — Z569 Unspecified problems related to employment: Secondary | ICD-10-CM | POA: Diagnosis not present

## 2023-06-16 DIAGNOSIS — Z609 Problem related to social environment, unspecified: Secondary | ICD-10-CM | POA: Diagnosis not present

## 2023-07-23 ENCOUNTER — Telehealth (INDEPENDENT_AMBULATORY_CARE_PROVIDER_SITE_OTHER): Payer: Self-pay | Admitting: Otolaryngology

## 2023-07-23 NOTE — Telephone Encounter (Signed)
 Tried to confirm appt & location VM Full [id number] afm

## 2023-07-24 ENCOUNTER — Institutional Professional Consult (permissible substitution) (INDEPENDENT_AMBULATORY_CARE_PROVIDER_SITE_OTHER): Payer: BC Managed Care – PPO

## 2023-07-24 NOTE — Progress Notes (Deleted)
 Dear Dr. Dwane, Here is my assessment for our mutual patient, Nathan Novak. Thank you for allowing me the opportunity to care for your patient. Please do not hesitate to contact me should you have any other questions. Sincerely, Dr. Eldora Blanch  Otolaryngology Clinic Note  HISTORY: Natnael Biederman is a 28 y.o. male kindly referred by Dr. Dwane for evaluation of nasal obstruction.    He reports ***.  Current symptoms include ***.  He denies ***.  Symptoms began *** ago.  Symptom severity is ***.  Improvement occurred with ***.  Additional evaluation has included ***.  Allergy testing *** been done.  *** No previous sinonasal surgery.  He is currently using ***.  GLP-1: *** AP/AC: ***  Tobacco: quit (prior nicotine use) Lives in ***  H&N Surgery: T&A as a child, CNR after nasal fracture  PMHx: ***  RADIOGRAPHIC EVALUATION AND INDEPENDENT REVIEW OF OTHER RECORDS:: Alfonso Dwane PA-C (06/03/2023): noted apneic episodes during sleeping with occassional morning headaches; Also noted deviated septum, broken nose in past and sent in office; continued issues;  Dx: Dev septum, Ref to ENT, advise breathe right strips; c/f OSA, ref to neuro Dr. 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, pollen with typical AR symptoms; Rx: Steroid nasal spray, PO antihstamine, breathe right strips Labs CBC and CMP 07/10/2023 reviewed independently: WBC 5.7, Plt 224, Eos 300, Cr 1.13, BUN 22 CT Face 08/14/2016 independently interpreted with respect to sinuses: mild ethmoid and max (right) fluid, massive left septal deviation, mildly displaced b/l NB fracture  Past Medical History:  Diagnosis Date   ADHD    Cellulitis    Chest pressure    History of anxiety    Lymphadenopathy    Palpitations    SOB (shortness of breath)    Past Surgical History:  Procedure  Laterality Date   TONSILECTOMY, ADENOIDECTOMY, BILATERAL MYRINGOTOMY AND TUBES     No family history on file. Social History   Tobacco Use   Smoking status: Never   Smokeless tobacco: Never  Substance Use Topics   Alcohol use: Yes   Allergies  Allergen Reactions   Cefzil [Cefprozil] Rash   Current Outpatient Medications  Medication Sig Dispense Refill   amphetamine-dextroamphetamine (ADDERALL XR) 20 MG 24 hr capsule Take 20 mg by mouth daily.     No current facility-administered medications for this visit.   There were no vitals taken for this visit.  PHYSICAL EXAM:  There were no vitals taken for this visit.   Salient findings:  CN II-XII intact *** Bilateral EAC clear and TM intact with well pneumatized middle ear spaces Weber 512: Rinne 512: AC > BC b/l *** Rine 1024: AC > BC b/l *** Nose: Anterior rhinoscopy reveals ***.  Nasal endoscopy was indicated to better evaluate the nose and paranasal sinuses, given the patient's history and exam findings, and is detailed below. No lesions of oral cavity/oropharynx; dentition *** No obviously palpable neck masses/lymphadenopathy/thyromegaly No respiratory distress or stridor***   PROCEDURE: Diagnostic Nasal Endoscopy Pre-procedure diagnosis: Concern for *** Post-procedure diagnosis: same Indication: See pre-procedure diagnosis and physical exam above Complications: None apparent EBL: *** mL Anesthesia: Lidocaine 4% and topical decongestant was topically sprayed in each nasal cavity  Description of Procedure:  Patient was identified. A rigid 30 degree endoscope was utilized to evaluate the sinonasal cavities, mucosa, sinus ostia and turbinates and septum.  Overall, signs of mucosal inflammation  are*** noted.  Also noted are ***.  No mucopurulence, polyps, or masses noted.   Right Middle meatus: *** Right SE Recess: *** Left MM: *** Left SE Recess: *** Photodocumentation was obtained.  CPT CODE -- 31231 - Mod  25   ASSESSMENT:  ***  No diagnosis found.   PLAN: We've discussed issues and options today.  We reviewed the nasal endoscopy images together.  The risks, benefits and alternatives were discussed and questions answered.  He has elected to proceed with:  1) 2) Follow-up in *** -- sooner as necessary.  See below regarding exact medications prescribed this encounter including dosages and route: No orders of the defined types were placed in this encounter.    Thank you for allowing me the opportunity to care for your patient. Please do not hesitate to contact me should you have any other questions.  Sincerely, Eldora Blanch, MD Otolaryngologist (ENT), [facility] Phone: [phone] Fax: [phone]  MDM:  Level ***: 99*** Complexity/Problems addressed: *** Data complexity: *** independent interpretation of *** - Morbidity: ***  - Prescription Drug prescribed or managed: ***  07/24/2023, 11:37 AM

## 2023-09-18 ENCOUNTER — Institutional Professional Consult (permissible substitution) (INDEPENDENT_AMBULATORY_CARE_PROVIDER_SITE_OTHER): Payer: BC Managed Care – PPO

## 2024-04-11 DIAGNOSIS — M25511 Pain in right shoulder: Secondary | ICD-10-CM | POA: Diagnosis not present
